# Patient Record
Sex: Male | Born: 1960 | Race: White | Hispanic: No | Smoking: Former smoker
Health system: Southern US, Community
[De-identification: ages and names within clinical notes are randomized; demographics above are authoritative.]

## PROBLEM LIST (undated history)

## (undated) DIAGNOSIS — E079 Disorder of thyroid, unspecified: Secondary | ICD-10-CM

## (undated) DIAGNOSIS — E7800 Pure hypercholesterolemia, unspecified: Secondary | ICD-10-CM

## (undated) DIAGNOSIS — E039 Hypothyroidism, unspecified: Secondary | ICD-10-CM

## (undated) DIAGNOSIS — M25551 Pain in right hip: Secondary | ICD-10-CM

## (undated) DIAGNOSIS — M1990 Unspecified osteoarthritis, unspecified site: Secondary | ICD-10-CM

## (undated) DIAGNOSIS — N400 Enlarged prostate without lower urinary tract symptoms: Secondary | ICD-10-CM

## (undated) DIAGNOSIS — G8929 Other chronic pain: Secondary | ICD-10-CM

## (undated) DIAGNOSIS — M25511 Pain in right shoulder: Secondary | ICD-10-CM

## (undated) DIAGNOSIS — M25552 Pain in left hip: Secondary | ICD-10-CM

## (undated) DIAGNOSIS — M25512 Pain in left shoulder: Secondary | ICD-10-CM

## (undated) HISTORY — PX: BACK SURGERY: SHX140

---

## 2011-03-22 ENCOUNTER — Other Ambulatory Visit (HOSPITAL_COMMUNITY): Payer: Self-pay | Admitting: *Deleted

## 2011-03-22 ENCOUNTER — Ambulatory Visit (HOSPITAL_COMMUNITY)
Admission: RE | Admit: 2011-03-22 | Discharge: 2011-03-22 | Disposition: A | Discharge status: Home / Self Care | Payer: Self-pay | Source: Ambulatory Visit | Attending: *Deleted | Admitting: *Deleted

## 2011-03-22 DIAGNOSIS — M79609 Pain in unspecified limb: Secondary | ICD-10-CM

## 2011-03-22 DIAGNOSIS — X58XXXA Exposure to other specified factors, initial encounter: Secondary | ICD-10-CM | POA: Insufficient documentation

## 2011-03-22 DIAGNOSIS — S4980XA Other specified injuries of shoulder and upper arm, unspecified arm, initial encounter: Secondary | ICD-10-CM | POA: Insufficient documentation

## 2011-03-22 DIAGNOSIS — M25519 Pain in unspecified shoulder: Secondary | ICD-10-CM | POA: Insufficient documentation

## 2011-03-22 DIAGNOSIS — S46909A Unspecified injury of unspecified muscle, fascia and tendon at shoulder and upper arm level, unspecified arm, initial encounter: Secondary | ICD-10-CM | POA: Insufficient documentation

## 2011-03-22 DIAGNOSIS — M899 Disorder of bone, unspecified: Secondary | ICD-10-CM | POA: Insufficient documentation

## 2011-03-22 DIAGNOSIS — M869 Osteomyelitis, unspecified: Secondary | ICD-10-CM | POA: Insufficient documentation

## 2012-06-24 ENCOUNTER — Emergency Department (HOSPITAL_COMMUNITY)
Admission: EM | Admit: 2012-06-24 | Discharge: 2012-06-24 | Disposition: A | Discharge status: Home / Self Care | Payer: Medicaid Other | Attending: Emergency Medicine | Admitting: Emergency Medicine

## 2012-06-24 ENCOUNTER — Encounter (HOSPITAL_COMMUNITY): Payer: Self-pay | Admitting: *Deleted

## 2012-06-24 DIAGNOSIS — M25511 Pain in right shoulder: Secondary | ICD-10-CM

## 2012-06-24 DIAGNOSIS — G8929 Other chronic pain: Secondary | ICD-10-CM | POA: Insufficient documentation

## 2012-06-24 DIAGNOSIS — F17200 Nicotine dependence, unspecified, uncomplicated: Secondary | ICD-10-CM | POA: Insufficient documentation

## 2012-06-24 DIAGNOSIS — IMO0001 Reserved for inherently not codable concepts without codable children: Secondary | ICD-10-CM | POA: Insufficient documentation

## 2012-06-24 DIAGNOSIS — M25519 Pain in unspecified shoulder: Secondary | ICD-10-CM | POA: Insufficient documentation

## 2012-06-24 DIAGNOSIS — M1990 Unspecified osteoarthritis, unspecified site: Secondary | ICD-10-CM

## 2012-06-24 DIAGNOSIS — M549 Dorsalgia, unspecified: Secondary | ICD-10-CM | POA: Insufficient documentation

## 2012-06-24 DIAGNOSIS — IMO0002 Reserved for concepts with insufficient information to code with codable children: Secondary | ICD-10-CM | POA: Insufficient documentation

## 2012-06-24 HISTORY — DX: Pain in left shoulder: M25.512

## 2012-06-24 HISTORY — DX: Pain in right shoulder: M25.511

## 2012-06-24 MED ORDER — ACETAMINOPHEN-CODEINE #3 300-30 MG PO TABS: 1.0000 | ORAL_TABLET | Freq: Four times a day (QID) | ORAL | Status: DC | PRN

## 2012-06-24 MED ORDER — DEXAMETHASONE 6 MG PO TABS: ORAL_TABLET | ORAL | Status: DC

## 2012-06-24 MED ORDER — DEXAMETHASONE SODIUM PHOSPHATE 4 MG/ML IJ SOLN
8.0000 mg | Freq: Once | INTRAMUSCULAR | Status: AC
  Administered 2012-06-24: 8 mg via INTRAMUSCULAR
  Filled 2012-06-24: qty 2

## 2012-06-24 NOTE — ED Provider Notes (Signed)
History     CSN: [id number]  Arrival date & time 06/24/12  1439   First MD Initiated Contact with Patient 06/24/12 1547      Chief Complaint  Patient presents with  . Shoulder Pain    (Consider location/radiation/quality/duration/timing/severity/associated sxs/prior treatment) Patient is a 52 y.o. male presenting with shoulder pain. The history is provided by the patient.  Shoulder Pain This is a chronic problem. The current episode started more than 1 month ago. The problem occurs daily. The problem has been gradually worsening. Associated symptoms include arthralgias and myalgias. Pertinent negatives include no abdominal pain, chest pain, coughing or neck pain. Exacerbated by: activity and weather changes. He has tried NSAIDs for the symptoms. The treatment provided moderate relief.    Past Medical History  Diagnosis Date  . Bilateral shoulder pain     Past Surgical History  Procedure Date  . Back surgery     No family history on file.  History  Substance Use Topics  . Smoking status: Current Every Day Smoker  . Smokeless tobacco: Not on file  . Alcohol Use: No      Review of Systems  Constitutional: Negative for activity change.       All ROS Neg except as noted in HPI  HENT: Negative for nosebleeds and neck pain.   Eyes: Negative for photophobia and discharge.  Respiratory: Negative for cough, shortness of breath and wheezing.   Cardiovascular: Negative for chest pain and palpitations.  Gastrointestinal: Negative for abdominal pain and blood in stool.  Genitourinary: Negative for dysuria, frequency and hematuria.  Musculoskeletal: Positive for myalgias, back pain and arthralgias.  Skin: Negative.   Neurological: Negative for dizziness, seizures and speech difficulty.  Psychiatric/Behavioral: Negative for hallucinations and confusion.    Allergies  Other  Home Medications   Current Outpatient Rx  Name  Route  Sig  Dispense  Refill  . IBUPROFEN 200 MG  PO TABS   Oral   Take 600-800 mg by mouth every 6 (six) hours as needed. For pain           BP 136/81  Pulse 85  Temp 97.3 F (36.3 C) (Oral)  Resp 20  Ht 5\' 8"  (1.727 m)  Wt 135 lb (61.236 kg)  BMI 20.53 kg/m2  SpO2 100%  Physical Exam  Nursing note and vitals reviewed. Constitutional: He is oriented to person, place, and time. He appears well-developed and well-nourished.  Non-toxic appearance.  HENT:  Head: Normocephalic.  Right Ear: Tympanic membrane and external ear normal.  Left Ear: Tympanic membrane and external ear normal.  Eyes: EOM and lids are normal. Pupils are equal, round, and reactive to light.  Neck: Normal range of motion. Neck supple. Carotid bruit is not present.  Cardiovascular: Normal rate, regular rhythm, normal heart sounds, intact distal pulses and normal pulses.   Pulmonary/Chest: Breath sounds normal. No respiratory distress.  Abdominal: Soft. Bowel sounds are normal. There is no tenderness. There is no guarding.  Musculoskeletal: Normal range of motion.       Pain with ROM of the right shoulder. No hot joints. Radial pulse symmetrical.  Deformity of the right bicep muscle (not new). DJD changes of the right and left upper ext. Cap refill less than 3 sec.  Lymphadenopathy:       Head (right side): No submandibular adenopathy present.       Head (left side): No submandibular adenopathy present.    He has no cervical adenopathy.  Neurological: He is alert  and oriented to person, place, and time. He has normal strength. No cranial nerve deficit or sensory deficit.  Skin: Skin is warm and dry.  Psychiatric: He has a normal mood and affect. His speech is normal.    ED Course  Procedures (including critical care time)  Labs Reviewed - No data to display No results found.   No diagnosis found.    MDM  I have reviewed nursing notes, vital signs, and all appropriate lab and imaging results for this patient. Patient states he has problems with  his right and left shoulders from time to time, but in the last week he has had more problems with his right shoulder extending down his right arm. The patient has had x-rays of these areas and show no evidence of fractures, but some demineralization changes are present. The patient is not seeing an orthopedic surgeon, and he is currently upset with his primary care physician and does not want to go see them at this time. The patient presents to the emergency department for additional evaluation and assistance with his pain. Examination is consistent with acute exacerbation of chronic degenerative joint changes. Prescription for Decadron 6 mg one daily and Tylenol with codeine #3 every 6 hours #20 tablets given to the patient. Patient given referral to orthopedics. Patient strongly advised to see his primary care physician or establish a primary care relationship.       Kathie Dike, [place] 06/24/12 1700

## 2012-06-24 NOTE — ED Notes (Signed)
Pt c/o right shoulder pain, fell on shoulder in November, has been seen by Dr. After the fall, but the pain has become worse over the past week,

## 2012-06-24 NOTE — ED Provider Notes (Signed)
Medical screening examination/treatment/procedure(s) were performed by non-physician practitioner and as supervising physician I was immediately available for consultation/collaboration.    Celene Kras, MD 06/24/12 [phone]

## 2013-09-26 ENCOUNTER — Other Ambulatory Visit: Payer: Self-pay | Admitting: Family Medicine

## 2013-09-26 ENCOUNTER — Ambulatory Visit (HOSPITAL_COMMUNITY)
Admission: RE | Admit: 2013-09-26 | Discharge: 2013-09-26 | Disposition: A | Discharge status: Home / Self Care | Payer: Medicare Other | Source: Ambulatory Visit | Attending: Family Medicine | Admitting: Family Medicine

## 2013-09-26 DIAGNOSIS — M1610 Unilateral primary osteoarthritis, unspecified hip: Secondary | ICD-10-CM | POA: Insufficient documentation

## 2013-09-26 DIAGNOSIS — M25551 Pain in right hip: Secondary | ICD-10-CM

## 2013-09-26 DIAGNOSIS — Z9181 History of falling: Secondary | ICD-10-CM | POA: Insufficient documentation

## 2013-09-26 DIAGNOSIS — M169 Osteoarthritis of hip, unspecified: Secondary | ICD-10-CM | POA: Insufficient documentation

## 2013-09-26 DIAGNOSIS — M25559 Pain in unspecified hip: Secondary | ICD-10-CM | POA: Insufficient documentation

## 2017-08-06 ENCOUNTER — Other Ambulatory Visit (HOSPITAL_COMMUNITY): Payer: Self-pay | Admitting: Family Medicine

## 2017-08-06 ENCOUNTER — Ambulatory Visit (HOSPITAL_COMMUNITY)
Admission: RE | Admit: 2017-08-06 | Discharge: 2017-08-06 | Disposition: A | Discharge status: Home / Self Care | Payer: Medicare Other | Source: Ambulatory Visit | Attending: Family Medicine | Admitting: Family Medicine

## 2017-08-06 DIAGNOSIS — G8929 Other chronic pain: Secondary | ICD-10-CM | POA: Diagnosis not present

## 2017-08-06 DIAGNOSIS — M160 Bilateral primary osteoarthritis of hip: Secondary | ICD-10-CM

## 2017-08-06 DIAGNOSIS — M799 Soft tissue disorder, unspecified: Secondary | ICD-10-CM | POA: Insufficient documentation

## 2017-09-25 ENCOUNTER — Emergency Department (HOSPITAL_COMMUNITY)
Admission: EM | Admit: 2017-09-25 | Discharge: 2017-09-25 | Disposition: A | Discharge status: Home / Self Care | Payer: Medicare Other | Attending: Emergency Medicine | Admitting: Emergency Medicine

## 2017-09-25 ENCOUNTER — Encounter (HOSPITAL_COMMUNITY): Payer: Self-pay | Admitting: Emergency Medicine

## 2017-09-25 ENCOUNTER — Other Ambulatory Visit: Payer: Self-pay

## 2017-09-25 DIAGNOSIS — Z87891 Personal history of nicotine dependence: Secondary | ICD-10-CM | POA: Diagnosis not present

## 2017-09-25 DIAGNOSIS — G5602 Carpal tunnel syndrome, left upper limb: Secondary | ICD-10-CM | POA: Diagnosis not present

## 2017-09-25 DIAGNOSIS — Z8739 Personal history of other diseases of the musculoskeletal system and connective tissue: Secondary | ICD-10-CM

## 2017-09-25 DIAGNOSIS — M159 Polyosteoarthritis, unspecified: Secondary | ICD-10-CM | POA: Insufficient documentation

## 2017-09-25 DIAGNOSIS — M791 Myalgia, unspecified site: Secondary | ICD-10-CM | POA: Diagnosis present

## 2017-09-25 HISTORY — DX: Disorder of thyroid, unspecified: E07.9

## 2017-09-25 HISTORY — DX: Pain in left hip: M25.552

## 2017-09-25 HISTORY — DX: Unspecified osteoarthritis, unspecified site: M19.90

## 2017-09-25 HISTORY — DX: Other chronic pain: G89.29

## 2017-09-25 HISTORY — DX: Pain in right hip: M25.551

## 2017-09-25 HISTORY — DX: Benign prostatic hyperplasia without lower urinary tract symptoms: N40.0

## 2017-09-25 HISTORY — DX: Pure hypercholesterolemia, unspecified: E78.00

## 2017-09-25 MED ORDER — DEXAMETHASONE SODIUM PHOSPHATE 10 MG/ML IJ SOLN
10.0000 mg | Freq: Once | INTRAMUSCULAR | Status: AC
  Administered 2017-09-25: 10 mg via INTRAMUSCULAR
  Filled 2017-09-25: qty 1

## 2017-09-25 MED ORDER — DEXAMETHASONE 4 MG PO TABS: 4.0000 mg | ORAL_TABLET | Freq: Two times a day (BID) | ORAL | 0 refills | Status: DC

## 2017-09-25 NOTE — ED Triage Notes (Signed)
PT c/o generalized joint/body aches over the past few months. PT states his PCP did blood work and hip xrays but he has no relief from naproxen at home.

## 2017-09-25 NOTE — Discharge Instructions (Signed)
I am concerned you may have a carpal tunnel problem with your left wrist.  Please use your wrist splint until seen by Dr. Romeo AppleHarrison, or the orthopedic specialist of your choice.  Heating pad to the area will be helpful.  Please continue your current medications.  Please add Decadron 2 times daily.

## 2017-09-25 NOTE — ED Provider Notes (Signed)
[facility] EMERGENCY DEPARTMENT Provider Note   CSN: [id number] Arrival date & time: 09/25/17  4098     History   Chief Complaint Chief Complaint  Patient presents with  . Generalized Body Aches    HPI Adam Robles is a 57 y.o. male.  Patient is a 57 year old male who presents to the emergency department with body aches, wrist pain, and hip pain.  The patient states that he has a history of arthritis, especially affecting his hip.  He states that in 2016 he sustained a major injury to the left upper extremity.  Since that time he has been having problems with pain off and on.  Most recently the pain is been more intense.  He states that especially at night he has throbbing pain of his left upper extremity.  He has a numbness and tingling of his left thumb, and he has an increasing pain of his wrist.  No recent injury or trauma to the left upper extremity.  He does not recall doing any excessive lifting, pushing, pulling, or straining involving the left upper extremity.  He states that this numb sensation at the very tip of his thumb comes and goes.  The pain seems to be worse at night, but can be present during the day as well.  He has been told that he had arthritis in multiple areas, but is not been told that he had any problem that would affect his thumb.  He presents now for assistance with this issue.  Primary CARE physician: Dr. Janna Arch.  The history is provided by the patient.    Past Medical History:  Diagnosis Date  . Arthritis   . Bilateral shoulder pain   . Chronic pain   . Chronic pain of both hips   . Enlarged prostate   . High cholesterol   . Thyroid disease     There are no active problems to display for this patient.   Past Surgical History:  Procedure Laterality Date  . BACK SURGERY     x2        Home Medications    Prior to Admission medications   Medication Sig Start Date End Date Taking? Authorizing Provider  acetaminophen-codeine  (TYLENOL #3) 300-30 MG per tablet Take 1-2 tablets by mouth every 6 (six) hours as needed for pain. 06/24/12   Ivery Quale, PA-C  dexamethasone (DECADRON) 6 MG tablet 1 po daily 06/24/12   Ivery Quale, PA-C  ibuprofen (ADVIL,MOTRIN) 200 MG tablet Take 600-800 mg by mouth every 6 (six) hours as needed. For pain    [provider]    Family History History reviewed. No pertinent family history.  Social History Social History   Tobacco Use  . Smoking status: Former Smoker    Types: Cigarettes    Last attempt to quit: 07/19/2016    Years since quitting: 1.1  . Smokeless tobacco: Never Used  Substance Use Topics  . Alcohol use: No  . Drug use: No     Allergies   Other   Review of Systems Review of Systems  Constitutional: Negative for activity change.       All ROS Neg except as noted in HPI  HENT: Negative for nosebleeds.   Eyes: Negative for photophobia and discharge.  Respiratory: Negative for cough, shortness of breath and wheezing.   Cardiovascular: Negative for chest pain and palpitations.  Gastrointestinal: Negative for abdominal pain and blood in stool.  Genitourinary: Negative for dysuria, frequency and hematuria.  Musculoskeletal:  Positive for arthralgias and myalgias. Negative for back pain and neck pain.  Skin: Negative.   Neurological: Negative for dizziness, seizures and speech difficulty.  Psychiatric/Behavioral: Negative for confusion and hallucinations.     Physical Exam Updated Vital Signs BP (!) 154/70 (BP Location: Right Arm)   Pulse 80   Temp 97.6 F (36.4 C) (Oral)   Resp 16   Ht 6' (1.829 m)   Wt 77.6 kg (171 lb)   SpO2 97%   BMI 23.19 kg/m   Physical Exam  Constitutional: He is oriented to person, place, and time. He appears well-developed and well-nourished.  Non-toxic appearance.  HENT:  Head: Normocephalic.  Right Ear: Tympanic membrane and external ear normal.  Left Ear: Tympanic membrane and external ear normal.  Eyes:  Pupils are equal, round, and reactive to light. EOM and lids are normal.  Neck: Normal range of motion. Neck supple. Carotid bruit is not present.  Cardiovascular: Normal rate, regular rhythm, normal heart sounds, intact distal pulses and normal pulses.  Pulmonary/Chest: Breath sounds normal. No respiratory distress.  Abdominal: Soft. Bowel sounds are normal. There is no tenderness. There is no guarding.  Musculoskeletal: Normal range of motion.  There is good range of motion of the left shoulder and elbow.  There is pain with range of motion involving the left wrist.  There is some pain to percussion.  There is a positive Tinel's sign.  There is no atrophy of the thenar eminence.  Capillary refill of the left upper extremity is 2 seconds.  Radial pulses 2+. No hot joints of the upper extremities right or left.  Lymphadenopathy:       Head (right side): No submandibular adenopathy present.       Head (left side): No submandibular adenopathy present.    He has no cervical adenopathy.  Neurological: He is alert and oriented to person, place, and time. He has normal strength. No cranial nerve deficit or sensory deficit.  Skin: Skin is warm and dry.  Psychiatric: He has a normal mood and affect. His speech is normal.  Nursing note and vitals reviewed.    ED Treatments / Results  Labs (all labs ordered are listed, but only abnormal results are displayed) Labs Reviewed - No data to display  EKG None  Radiology No results found.  Procedures Procedures (including critical care time)  Medications Ordered in ED Medications - No data to display   Initial Impression / Assessment and Plan / ED Course  I have reviewed the triage vital signs and the nursing notes.  Pertinent labs & imaging results that were available during my care of the patient were reviewed by me and considered in my medical decision making (see chart for details).      Final Clinical Impressions(s) / ED  Diagnoses MDM  Vital signs reviewed.  Recent laboratory records from Dr. Otilio Saber office reviewed.  Patient has a history of arthritis and chronic problems with hip pain, and wrist pain.  The examination questions possible carpal tunnel on the left.  Patient fitted with a wrist splint.  He is referred to Dr. Romeo Apple for orthopedic evaluation concerning the possible carpal tunnel.   Final diagnoses:  Carpal tunnel syndrome of left wrist  History of arthritis    ED Discharge Orders        Ordered    dexamethasone (DECADRON) 4 MG tablet  2 times daily with meals     09/25/17 1021       Ivery Quale, PA-C  09/25/17 1022    Samuel JesterMcManus, Kathleen, DO 09/28/17 [phone]

## 2017-09-25 NOTE — ED Notes (Signed)
Pt reports chronic bilateral hip and lower back pain. Occasional neck pain

## 2018-01-02 ENCOUNTER — Ambulatory Visit (HOSPITAL_COMMUNITY)
Admission: RE | Admit: 2018-01-02 | Discharge: 2018-01-02 | Disposition: A | Discharge status: Home / Self Care | Payer: Medicare Other | Source: Ambulatory Visit | Attending: Family Medicine | Admitting: Family Medicine

## 2018-01-02 ENCOUNTER — Other Ambulatory Visit (HOSPITAL_COMMUNITY): Payer: Self-pay | Admitting: Family Medicine

## 2018-01-02 DIAGNOSIS — R609 Edema, unspecified: Secondary | ICD-10-CM | POA: Insufficient documentation

## 2018-01-02 DIAGNOSIS — M25532 Pain in left wrist: Secondary | ICD-10-CM | POA: Insufficient documentation

## 2018-01-02 DIAGNOSIS — M11232 Other chondrocalcinosis, left wrist: Secondary | ICD-10-CM | POA: Insufficient documentation

## 2018-01-02 DIAGNOSIS — M19032 Primary osteoarthritis, left wrist: Secondary | ICD-10-CM | POA: Diagnosis not present

## 2018-02-12 ENCOUNTER — Encounter: Payer: Self-pay | Admitting: Orthopaedic Surgery

## 2018-02-12 ENCOUNTER — Ambulatory Visit (INDEPENDENT_AMBULATORY_CARE_PROVIDER_SITE_OTHER): Payer: Medicare Other | Admitting: Orthopaedic Surgery

## 2018-02-12 VITALS — BP 115/71 | HR 83 | Ht 73.0 in | Wt 169.0 lb

## 2018-02-12 DIAGNOSIS — M25532 Pain in left wrist: Secondary | ICD-10-CM | POA: Diagnosis not present

## 2018-02-12 NOTE — Progress Notes (Signed)
Subjective:    Patient ID: Adam Robles, male    DOB: 09-07-1960, 57 y.o.   MRN: [id number]  HPI He has had pain in the left wrist for several months.  He has seen his family doctor, Dr. Janna Arch for this and the ER as well.  He has numbness of the fingers of the left hand, the index, long and ring.  He has pain of the left wrist with swelling and decreased ROM.  He has no redness.  He denies any trauma.  He has tried ice, heat, Advil with only slight help.  He has had prednisone which helped for a while.  He has been on Naprosyn which helps some.  He has a cock-up splint that helps but he is tired of using it.  He has no redness.  He has pain medicine for chronic pain.  X-rays showed: IMPRESSION: No traumatic finding. 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   Review of Systems  Constitutional: Positive for activity change.  Musculoskeletal: Positive for arthralgias and joint swelling.  All other systems reviewed and are negative.  For Review of Systems, all other systems reviewed and are negative.  The following is a summary of the past history medically, past history surgically, known current medicines, social history and family history.  This information is gathered electronically by the computer from prior information and documentation.  I review this each visit and have found including this information at this point in the chart is beneficial and informative.   Past Medical History:  Diagnosis Date  . Arthritis   . Bilateral shoulder pain   . Chronic pain   . Chronic pain of both hips   . Enlarged prostate   . High cholesterol   . Thyroid disease     Past Surgical History:  Procedure Laterality Date  . BACK SURGERY     x2    Current Outpatient Medications on File Prior to Visit  Medication Sig Dispense Refill  .  gabapentin (NEURONTIN) 300 MG capsule Take by mouth 3 (three) times daily.  3  . levothyroxine (SYNTHROID, LEVOTHROID) 150 MCG tablet Take 150 mcg by mouth daily.  5  . naproxen (NAPROSYN) 500 MG tablet Take 500 mg by mouth 2 (two) times daily with a meal.  3  . oxyCODONE (OXY IR/ROXICODONE) 5 MG immediate release tablet Take 5 mg by mouth 4 (four) times daily.  0  . pravastatin (PRAVACHOL) 40 MG tablet Take 40 mg by mouth daily.  6  . tamsulosin (FLOMAX) 0.4 MG CAPS capsule      No current facility-administered medications on file prior to visit.     Social History   Socioeconomic History  . Marital status: Legally Separated    Spouse name: Not on file  . Number of children: Not on file  . Years of education: Not on file  . Highest education level: Not on file  Occupational History  . Not on file  Social Needs  . Financial resource strain: Not on file  . Food insecurity:    Worry: Not on file    Inability: Not on file  . Transportation needs:    Medical: Not on file    Non-medical: Not on file  Tobacco Use  . Smoking status: Former Smoker    Types: Cigarettes    Last attempt to quit: 07/19/2016    Years since quitting:  1.5  . Smokeless tobacco: Never Used  Substance and Sexual Activity  . Alcohol use: No  . Drug use: No  . Sexual activity: Not on file  Lifestyle  . Physical activity:    Days per week: Not on file    Minutes per session: Not on file  . Stress: Not on file  Relationships  . Social connections:    Talks on phone: Not on file    Gets together: Not on file    Attends religious service: Not on file    Active member of club or organization: Not on file    Attends meetings of clubs or organizations: Not on file    Relationship status: Not on file  . Intimate partner violence:    Fear of current or ex partner: Not on file    Emotionally abused: Not on file    Physically abused: Not on file    Forced sexual activity: Not on file  Other Topics Concern  .  Not on file  Social History Narrative  . Not on file    Family History  Problem Relation Age of Onset  . Cancer Mother   . Emphysema Father     BP 115/71   Pulse 83   Ht 6\' 1"  (1.854 m)   Wt 169 lb (76.7 kg)   BMI 22.30 kg/m   Body mass index is 22.3 kg/m.     Objective:   Physical Exam  Constitutional: He is oriented to person, place, and time. He appears well-developed and well-nourished.  HENT:  Head: Normocephalic and atraumatic.  Eyes: Pupils are equal, round, and reactive to light. Conjunctivae and EOM are normal.  Neck: Normal range of motion. Neck supple.  Cardiovascular: Normal rate, regular rhythm and intact distal pulses.  Pulmonary/Chest: Effort normal.  Abdominal: Soft.  Musculoskeletal:       Left wrist: He exhibits decreased range of motion, tenderness and swelling.       Hands: Neurological: He is alert and oriented to person, place, and time. He has normal reflexes. He displays normal reflexes. No cranial nerve deficit. He exhibits normal muscle tone. Coordination normal.  Skin: Skin is warm and dry.  Psychiatric: He has a normal mood and affect. His behavior is normal. Judgment and thought content normal.    I have reviewed the notes from Dr. Janna ArchonDiego.  I have reviewed his xray report and X-rays.  I have reviewed the ER notes.      Assessment & Plan:   Encounter Diagnosis  Name Primary?  . Left wrist pain Yes   I would like to get EMGs to rule out carpal tunnel.  I would like to get MRI of the left wrist.  He has degenerative changes more of the radial side of the wrist with changes in the triangular cartilage area.  He is to continue his Naprosyn and the cock-up splint.  Return after the above studies are done.  Call if any problem.  Precautions discussed.   Electronically Signed Darreld McleanWayne Elaysia Devargas, MD 8/28/20198:21 AM

## 2018-02-12 NOTE — Patient Instructions (Signed)
..  An order has been sent to [facility]  [address] [place], [place]  [address].  Phone number [phone] requesting a nerve conduction study for you.  They will call you with an appointment.  If you have not been given an appointment in a reasonable amount of time please call them and ask to be scheduled.  Your MRI has been scheduled for Tuesday 02/18/18 at 3:pppm.  You will need to arive at [facility] Radiology department at 2:30pm.

## 2018-02-18 ENCOUNTER — Ambulatory Visit (HOSPITAL_COMMUNITY)
Admission: RE | Admit: 2018-02-18 | Discharge: 2018-02-18 | Disposition: A | Discharge status: Home / Self Care | Payer: Medicare Other | Source: Ambulatory Visit | Attending: Orthopaedic Surgery | Admitting: Orthopaedic Surgery

## 2018-02-18 DIAGNOSIS — M25532 Pain in left wrist: Secondary | ICD-10-CM | POA: Diagnosis not present

## 2018-03-05 ENCOUNTER — Ambulatory Visit (INDEPENDENT_AMBULATORY_CARE_PROVIDER_SITE_OTHER): Payer: Medicare Other | Admitting: Orthopaedic Surgery

## 2018-03-05 ENCOUNTER — Encounter: Payer: Self-pay | Admitting: Orthopaedic Surgery

## 2018-03-05 VITALS — BP 116/68 | HR 77 | Ht 73.0 in | Wt 169.0 lb

## 2018-03-05 DIAGNOSIS — G5602 Carpal tunnel syndrome, left upper limb: Secondary | ICD-10-CM | POA: Diagnosis not present

## 2018-03-05 NOTE — Progress Notes (Signed)
Patient ZO:XWRUEAV:Adam Robles, male DOB:06/12/1961, 57 y.o. WUJ:[id number]  Chief Complaint  Patient presents with  . Wrist Pain    left    HPI  Adam Robles is a 57 y.o. male who has pain the left wrist.  Adam Robles had EMGs done which show bilateral carpal tunnel, more on the left. I have explained the findings of the EMG to him.  I have recommended surgery.  I will have Dr. Romeo AppleHarrison see him. The patient is agreeable to this.   Body mass index is 22.3 kg/m.  ROS  Review of Systems  Constitutional: Positive for activity change.  Musculoskeletal: Positive for arthralgias and joint swelling.  All other systems reviewed and are negative.   All other systems reviewed and are negative.  The following is a summary of the past history medically, past history surgically, known current medicines, social history and family history.  This information is gathered electronically by the computer from prior information and documentation.  I review this each visit and have found including this information at this point in the chart is beneficial and informative.    Past Medical History:  Diagnosis Date  . Arthritis   . Bilateral shoulder pain   . Chronic pain   . Chronic pain of both hips   . Enlarged prostate   . High cholesterol   . Thyroid disease     Past Surgical History:  Procedure Laterality Date  . BACK SURGERY     x2    Family History  Problem Relation Age of Onset  . Cancer Mother   . Emphysema Father     Social History Social History   Tobacco Use  . Smoking status: Former Smoker    Types: Cigarettes    Last attempt to quit: 07/19/2016    Years since quitting: 1.6  . Smokeless tobacco: Never Used  Substance Use Topics  . Alcohol use: No  . Drug use: No    Allergies  Allergen Reactions  . Other     Can't take some pain medications due to gastric ulcer.     Current Outpatient Medications  Medication Sig Dispense Refill  . gabapentin (NEURONTIN) 300 MG capsule  Take by mouth 3 (three) times daily.  3  . levothyroxine (SYNTHROID, LEVOTHROID) 150 MCG tablet Take 150 mcg by mouth daily.  5  . naproxen (NAPROSYN) 500 MG tablet Take 500 mg by mouth 2 (two) times daily with a meal.  3  . oxyCODONE (OXY IR/ROXICODONE) 5 MG immediate release tablet Take 5 mg by mouth 4 (four) times daily.  0  . pravastatin (PRAVACHOL) 40 MG tablet Take 40 mg by mouth daily.  6  . tamsulosin (FLOMAX) 0.4 MG CAPS capsule      No current facility-administered medications for this visit.      Physical Exam  Blood pressure 116/68, pulse 77, height 6\' 1"  (1.854 m), weight 169 lb (76.7 kg).  Constitutional: overall normal hygiene, normal nutrition, well developed, normal grooming, normal body habitus. Assistive device:left cock-up splint  Musculoskeletal: gait and station Limp none, muscle tone and strength are normal, no tremors or atrophy is present.  .  Neurological: coordination overall normal.  Deep tendon reflex/nerve stretch intact.  Sensation normal.  Cranial nerves II-XII intact.   Skin:   Normal overall no scars, lesions, ulcers or rashes. No psoriasis.  Psychiatric: Alert and oriented x 3.  Recent memory intact, remote memory unclear.  Normal mood and affect. Well groomed.  Good eye contact.  Cardiovascular: overall no swelling, no varicosities, no edema bilaterally, normal temperatures of the legs and arms, no clubbing, cyanosis and good capillary refill.  Lymphatic: palpation is normal.  Adam Robles has positive Phalen and negative Tinel on the left and right wrist.  The left wrist is more painful.  Adam Robles has decreased sensation of the median nerve.  All other systems reviewed and are negative   The patient has been educated about the nature of the problem(s) and counseled on treatment options.  The patient appeared to understand what I have discussed and is in agreement with it.  Encounter Diagnosis  Name Primary?  . Carpal tunnel syndrome on left Yes     PLAN Call if any problems.  Precautions discussed.  Continue current medications.   Return to clinic to see Dr. Romeo Apple   Electronically Signed Darreld Mclean, MD 9/18/20199:30 AM

## 2018-03-14 ENCOUNTER — Ambulatory Visit (INDEPENDENT_AMBULATORY_CARE_PROVIDER_SITE_OTHER): Payer: Medicare Other | Admitting: Orthopedic Surgery

## 2018-03-14 ENCOUNTER — Encounter: Payer: Self-pay | Admitting: Orthopedic Surgery

## 2018-03-14 VITALS — BP 119/77 | HR 65 | Ht 73.0 in | Wt 168.0 lb

## 2018-03-14 DIAGNOSIS — G5602 Carpal tunnel syndrome, left upper limb: Secondary | ICD-10-CM | POA: Diagnosis not present

## 2018-03-14 NOTE — H&P (View-Only) (Signed)
PREOP CONSULT/REFERRAL INTRA-OFFICE FROM DR JW KEELING   Chief Complaint  Patient presents with  . Carpal Tunnel    left     This 57-year-old male has had 6 months of pain in his left and right hand with 3 months of pain and swelling in the left upper extremity with complaints of burning pain mild to moderate not improved with splinting.  He saw Dr. Keeling is nonoperative treatments did not work he had a nerve study and EMG study which shows he has bilateral moderate carpal tunnel syndrome  He presents for surgical evaluation he does have a risk factor of thyroid disease is being hypothyroid.  His other treatments included Naprosyn and gabapentin without relief he is ready for surgery on his left hand   Review of Systems  Constitutional: Negative for fever.  Musculoskeletal: Positive for joint pain.  Skin: Negative.   Neurological: Positive for tingling.     Past Medical History:  Diagnosis Date  . Arthritis   . Bilateral shoulder pain   . Chronic pain   . Chronic pain of both hips   . Enlarged prostate   . High cholesterol   . Thyroid disease     Past Surgical History:  Procedure Laterality Date  . BACK SURGERY     x2    Family History  Problem Relation Age of Onset  . Cancer Mother   . Emphysema Father    Social History   Tobacco Use  . Smoking status: Former Smoker    Types: Cigarettes    Last attempt to quit: 07/19/2016    Years since quitting: 1.6  . Smokeless tobacco: Never Used  Substance Use Topics  . Alcohol use: No  . Drug use: No    Allergies  Allergen Reactions  . Other     Can't take some pain medications due to gastric ulcer.      Current Meds  Medication Sig  . gabapentin (NEURONTIN) 300 MG capsule Take by mouth 3 (three) times daily.  . levothyroxine (SYNTHROID, LEVOTHROID) 150 MCG tablet Take 150 mcg by mouth daily.  . naproxen (NAPROSYN) 500 MG tablet Take 500 mg by mouth 2 (two) times daily with a meal.  . oxyCODONE (OXY  IR/ROXICODONE) 5 MG immediate release tablet Take 5 mg by mouth 4 (four) times daily.  . pravastatin (PRAVACHOL) 40 MG tablet Take 40 mg by mouth daily.  . tamsulosin (FLOMAX) 0.4 MG CAPS capsule     BP 119/77   Pulse 65   Ht 6' 1" (1.854 m)   Wt 168 lb (76.2 kg)   BMI 22.16 kg/m    Physical Exam  Constitutional: He is oriented to person, place, and time. He appears well-developed and well-nourished.  Vital signs have been reviewed and are stable. Gen. appearance the patient is well-developed and well-nourished with normal grooming and hygiene.   Neurological: He is alert and oriented to person, place, and time.  Skin: Skin is warm and dry. No erythema.  Psychiatric: He has a normal mood and affect.  Vitals reviewed.   Ortho Exam   Left upper extremity swelling around the wrist joint with tenderness and decreased range of motion the wrist joint remains stable his grip strength seems less than normal his skin is intact he has a good pulse and normal temperatures is no lymphadenopathy he has decreased sensation in his thumb index and long finger no pathologic reflexes were elicited and his overall coordination was normal  Right upper extremity again exhibits   sensory changes in the median nerve with normal pulse and perfusion.  Elbow area lymph nodes negative skin intact strength and muscle tone are normal wrist is stable and range of motion no deficits no malalignment is seen.  MEDICAL DECISION SECTION  MRI report wristIMPRESSION: 1. Suspected erosions along the radiocarpal joints with effusion and possible synovitis in the radiocarpal joint and distal radioulnar joint. Appearance suspicious for erosive or inflammatory arthropathy. The patient has known calcification in the TFCC disc, based on conventional radiographs, and CPPD arthropathy is not excluded. 2. Although not well seen on today's MRI, conventional radiographs raise the possibility of a tear of the scapholunate  ligament. 3. Distal flexor carpi radialis tendinopathy. 4. Despite efforts by the technologist and patient, motion artifact is present on today's exam and could not be eliminated. This reduces exam sensitivity and specificity.     Electronically Signed   By: Walter  Liebkemann M.D.   On: 02/19/2018 07:33  He also had an x-ray of his wrist 3 views My interpretation of that x-rays that he has radial scaphoid and scaphoid trapezial arthritis    Encounter Diagnosis  Name Primary?  . Carpal tunnel syndrome on left Yes     PLAN:   Surgical procedure planned: LEFT CARPAL TUNNEL RELEASE  The procedure has been fully reviewed with the patient; The risks and benefits of surgery have been discussed and explained and understood. Alternative treatment has also been reviewed, questions were encouraged and answered. The postoperative plan is also been reviewed.  Nonsurgical treatment as described in the history and physical section was attempted and unsuccessful and the patient has agreed to proceed with surgical intervention to improve their situation.  No orders of the defined types were placed in this encounter.   Stanley Harrison, MD 03/14/2018 9:05 AM   

## 2018-03-14 NOTE — Patient Instructions (Signed)
Carpal Tunnel Release Carpal tunnel release is a surgical procedure to relieve numbness and pain in your hand that are caused by carpal tunnel syndrome. Your carpal tunnel is a narrow, hollow space in your wrist. It passes between your wrist bones and a band of connective tissue (transverse carpal ligament). The nerve that supplies most of your hand (median nerve) passes through this space, and so do the connections between your fingers and the muscles of your arm (tendons). Carpal tunnel syndrome makes this space swell and become narrow, and this causes pain and numbness. In carpal tunnel release surgery, a surgeon cuts through the transverse carpal ligament to make more room in the carpal tunnel space. You may have this surgery if other types of treatment have not worked. Tell a health care provider about:  Any allergies you have.  All medicines you are taking, including vitamins, herbs, eye drops, creams, and over-the-counter medicines.  Any problems you or family members have had with anesthetic medicines.  Any blood disorders you have.  Any surgeries you have had.  Any medical conditions you have. What are the risks? Generally, this is a safe procedure. However, problems may occur, including:  Bleeding.  Infection.  Injury to the median nerve.  Need for additional surgery.  What happens before the procedure?  Ask your health care provider about: ? Changing or stopping your regular medicines. This is especially important if you are taking diabetes medicines or blood thinners. ? Taking medicines such as aspirin and ibuprofen. These medicines can thin your blood. Do not take these medicines before your procedure if your health care provider instructs you not to.  Do not eat or drink anything after midnight on the night before the procedure or as directed by your health care provider.  Plan to have someone take you home after the procedure. What happens during the  procedure?  An IV tube may be inserted into a vein.  You will be given one of the following: ? A medicine that numbs the wrist area (local anesthetic). You may also be given a medicine to make you relax (sedative). ? A medicine that makes you go to sleep (general anesthetic).  Your arm, hand, and wrist will be cleaned with a germ-killing solution (antiseptic).  Your surgeon will make a surgical cut (incision) over the palm side of your wrist. The surgeon will pull aside the skin of your wrist to expose the carpal tunnel space.  The surgeon will cut the transverse carpal ligament.  The edges of the incision will be closed with stitches (sutures) or staples.  A bandage (dressing) will be placed over your wrist and wrapped around your hand and wrist. What happens after the procedure?  You may spend some time in a recovery area.  Your blood pressure, heart rate, breathing rate, and blood oxygen level will be monitored often until the medicines you were given have worn off.  You will likely have some pain. You will be given pain medicine.  You may need to wear a splint or a wrist brace over your dressing. This information is not intended to replace advice given to you by your health care provider. Make sure you discuss any questions you have with your health care provider. Document Released: 08/25/2003 Document Revised: 11/10/2015 Document Reviewed: 01/20/2014 Elsevier Interactive Patient Education  2018 Elsevier Inc[employer]  

## 2018-03-14 NOTE — Progress Notes (Signed)
PREOP CONSULT/REFERRAL INTRA-OFFICE FROM DR Gaylene Brooks   Chief Complaint  Patient presents with  . Carpal Tunnel    left     This 57 year old male has had 6 months of pain in his left and right hand with 3 months of pain and swelling in the left upper extremity with complaints of burning pain mild to moderate not improved with splinting.  He saw Dr. Hilda Lias is nonoperative treatments did not work he had a nerve study and EMG study which shows he has bilateral moderate carpal tunnel syndrome  He presents for surgical evaluation he does have a risk factor of thyroid disease is being hypothyroid.  His other treatments included Naprosyn and gabapentin without relief he is ready for surgery on his left hand   Review of Systems  Constitutional: Negative for fever.  Musculoskeletal: Positive for joint pain.  Skin: Negative.   Neurological: Positive for tingling.     Past Medical History:  Diagnosis Date  . Arthritis   . Bilateral shoulder pain   . Chronic pain   . Chronic pain of both hips   . Enlarged prostate   . High cholesterol   . Thyroid disease     Past Surgical History:  Procedure Laterality Date  . BACK SURGERY     x2    Family History  Problem Relation Age of Onset  . Cancer Mother   . Emphysema Father    Social History   Tobacco Use  . Smoking status: Former Smoker    Types: Cigarettes    Last attempt to quit: 07/19/2016    Years since quitting: 1.6  . Smokeless tobacco: Never Used  Substance Use Topics  . Alcohol use: No  . Drug use: No    Allergies  Allergen Reactions  . Other     Can't take some pain medications due to gastric ulcer.      Current Meds  Medication Sig  . gabapentin (NEURONTIN) 300 MG capsule Take by mouth 3 (three) times daily.  Marland Kitchen levothyroxine (SYNTHROID, LEVOTHROID) 150 MCG tablet Take 150 mcg by mouth daily.  . naproxen (NAPROSYN) 500 MG tablet Take 500 mg by mouth 2 (two) times daily with a meal.  . oxyCODONE (OXY  IR/ROXICODONE) 5 MG immediate release tablet Take 5 mg by mouth 4 (four) times daily.  . pravastatin (PRAVACHOL) 40 MG tablet Take 40 mg by mouth daily.  . tamsulosin (FLOMAX) 0.4 MG CAPS capsule     BP 119/77   Pulse 65   Ht 6\' 1"  (1.854 m)   Wt 168 lb (76.2 kg)   BMI 22.16 kg/m    Physical Exam  Constitutional: He is oriented to person, place, and time. He appears well-developed and well-nourished.  Vital signs have been reviewed and are stable. Gen. appearance the patient is well-developed and well-nourished with normal grooming and hygiene.   Neurological: He is alert and oriented to person, place, and time.  Skin: Skin is warm and dry. No erythema.  Psychiatric: He has a normal mood and affect.  Vitals reviewed.   Ortho Exam   Left upper extremity swelling around the wrist joint with tenderness and decreased range of motion the wrist joint remains stable his grip strength seems less than normal his skin is intact he has a good pulse and normal temperatures is no lymphadenopathy he has decreased sensation in his thumb index and long finger no pathologic reflexes were elicited and his overall coordination was normal  Right upper extremity again exhibits  sensory changes in the median nerve with normal pulse and perfusion.  Elbow area lymph nodes negative skin intact strength and muscle tone are normal wrist is stable and range of motion no deficits no malalignment is seen.  MEDICAL DECISION SECTION  MRI report wristIMPRESSION: 1. Suspected erosions along the radiocarpal joints with effusion and possible synovitis in the radiocarpal joint and distal radioulnar joint. Appearance suspicious for erosive or inflammatory arthropathy. The patient has known calcification in the TFCC disc, based on conventional radiographs, and CPPD arthropathy is not excluded. 2. Although not well seen on today's MRI, conventional radiographs raise the possibility of a tear of the scapholunate  ligament. 3. Distal flexor carpi radialis tendinopathy. 4. Despite efforts by the technologist and patient, motion artifact is present on today's exam and could not be eliminated. This reduces exam sensitivity and specificity.     Electronically Signed   By: Gaylyn Rong M.D.   On: 02/19/2018 07:33  He also had an x-ray of his wrist 3 views My interpretation of that x-rays that he has radial scaphoid and scaphoid trapezial arthritis    Encounter Diagnosis  Name Primary?  . Carpal tunnel syndrome on left Yes     PLAN:   Surgical procedure planned: LEFT CARPAL TUNNEL RELEASE  The procedure has been fully reviewed with the patient; The risks and benefits of surgery have been discussed and explained and understood. Alternative treatment has also been reviewed, questions were encouraged and answered. The postoperative plan is also been reviewed.  Nonsurgical treatment as described in the history and physical section was attempted and unsuccessful and the patient has agreed to proceed with surgical intervention to improve their situation.  No orders of the defined types were placed in this encounter.   Fuller Canada, MD 03/14/2018 9:05 AM

## 2018-03-24 NOTE — Patient Instructions (Signed)
Your procedure is scheduled on: 03/27/2018  Report to [facility] at   11:00  AM.  Call this number if you have problems the morning of surgery: [phone]   Remember:   Do not drink or eat food:After Midnight.  :  Take these medicines the morning of surgery with A SIP OF WATER: Levothyroxine   Take Gabapentin and oxycodone if needed      Do not wear jewelry, make-up or nail polish.  Do not wear lotions, powders, or perfumes. You may wear deodorant.  Do not shave 48 hours prior to surgery. Men may shave face and neck.  Do not bring valuables to the hospital.  Contacts, dentures or bridgework may not be worn into surgery.  Leave suitcase in the car. After surgery it may be brought to your room.  For patients admitted to the hospital, checkout time is 11:00 AM the day of discharge.   Patients discharged the day of surgery will not be allowed to drive home.    Special Instructions: Shower using CHG night before surgery and shower the day of surgery use CHG.  Use special wash - you have one bottle of CHG for all showers.  You should use approximately 1/2 of the bottle for each shower.  Carpal Tunnel Release Carpal tunnel release is a surgical procedure to relieve numbness and pain in your hand that are caused by carpal tunnel syndrome. Your carpal tunnel is a narrow, hollow space in your wrist. It passes between your wrist bones and a band of connective tissue (transverse carpal ligament). The nerve that supplies most of your hand (median nerve) passes through this space, and so do the connections between your fingers and the muscles of your arm (tendons). Carpal tunnel syndrome makes this space swell and become narrow, and this causes pain and numbness. In carpal tunnel release surgery, a surgeon cuts through the transverse carpal ligament to make more room in the carpal tunnel space. You may have this surgery if other types of treatment have not worked. Tell a health care provider  about:  Any allergies you have.  All medicines you are taking, including vitamins, herbs, eye drops, creams, and over-the-counter medicines.  Any problems you or family members have had with anesthetic medicines.  Any blood disorders you have.  Any surgeries you have had.  Any medical conditions you have. What are the risks? Generally, this is a safe procedure. However, problems may occur, including:  Bleeding.  Infection.  Injury to the median nerve.  Need for additional surgery.  What happens before the procedure?  Ask your health care provider about: ? Changing or stopping your regular medicines. This is especially important if you are taking diabetes medicines or blood thinners. ? Taking medicines such as aspirin and ibuprofen. These medicines can thin your blood. Do not take these medicines before your procedure if your health care provider instructs you not to.  Do not eat or drink anything after midnight on the night before the procedure or as directed by your health care provider.  Plan to have someone take you home after the procedure. What happens during the procedure?  An IV tube may be inserted into a vein.  You will be given one of the following: ? A medicine that numbs the wrist area (local anesthetic). You may also be given a medicine to make you relax (sedative). ? A medicine that makes you go to sleep (general anesthetic).  Your arm, hand, and wrist will be cleaned  with a germ-killing solution (antiseptic).  Your surgeon will make a surgical cut (incision) over the palm side of your wrist. The surgeon will pull aside the skin of your wrist to expose the carpal tunnel space.  The surgeon will cut the transverse carpal ligament.  The edges of the incision will be closed with stitches (sutures) or staples.  A bandage (dressing) will be placed over your wrist and wrapped around your hand and wrist. What happens after the procedure?  You may spend some  time in a recovery area.  Your blood pressure, heart rate, breathing rate, and blood oxygen level will be monitored often until the medicines you were given have worn off.  You will likely have some pain. You will be given pain medicine.  You may need to wear a splint or a wrist brace over your dressing. This information is not intended to replace advice given to you by your health care provider. Make sure you discuss any questions you have with your health care provider. Document Released: 08/25/2003 Document Revised: 11/10/2015 Document Reviewed: 01/20/2014 Elsevier Interactive Patient Education  2018 [employer] Inc[employer] Carpal Tunnel Release, Care After Refer to this sheet in the next few weeks. These instructions provide you with information about caring for yourself after your procedure. Your health care provider may also give you more specific instructions. Your treatment has been planned according to current medical practices, but problems sometimes occur. Call your health care provider if you have any problems or questions after your procedure. What can I expect after the procedure? After your procedure, it is typical to have the following:  Pain.  Numbness.  Tingling.  Swelling.  Stiffness.  Bruising.  Follow these instructions at home:  Take medicines only as directed by your health care provider.  There are many different ways to close and cover an incision, including stitches (sutures), skin glue, and adhesive strips. Follow your health care provider's instructions about: ? Incision care. ? Bandage (dressing) changes and removal. ? Incision closure removal.  Wear a splint or a brace as directed by your surgeon. You may need to do this for 2-3 weeks.  Keep your hand raised (elevated) above the level of your heart while you are resting. Move your fingers often.  Avoid activities that cause hand pain.  Ask your surgeon when you can start to do all of your usual  activities again, such as: ? Driving. ? Returning to work. ? Bathing and swimming.  Keep all follow-up visits as directed by your health care provider. This is important. You may need physical therapy for several months to speed healing and regain movement. Contact a health care provider if:  You have drainage, redness, swelling, or pain at your incision site.  You have a fever.  You have chills.  Your pain medicine is not working.  Your symptoms do not go away after 2 months.  Your symptoms go away and then return. Get help right away if:  You have pain or numbness that is getting worse.  Your fingers change color.  You are not able to move your fingers. This information is not intended to replace advice given to you by your health care provider. Make sure you discuss any questions you have with your health care provider. Document Released: 12/22/2004 Document Revised: 11/10/2015 Document Reviewed: 01/20/2014 Elsevier Interactive Patient Education  2018 [employer] Inc[employer]  Monitored Anesthesia Care, Care After These instructions provide you with information about caring for yourself after your procedure. Your health care  provider may also give you more specific instructions. Your treatment has been planned according to current medical practices, but problems sometimes occur. Call your health care provider if you have any problems or questions after your procedure. What can I expect after the procedure? After your procedure, it is common to:  Feel sleepy for several hours.  Feel clumsy and have poor balance for several hours.  Feel forgetful about what happened after the procedure.  Have poor judgment for several hours.  Feel nauseous or vomit.  Have a sore throat if you had a breathing tube during the procedure.  Follow these instructions at home: For at least 24 hours after the procedure:   Do not: ? Participate in activities in which you could fall or become  injured. ? Drive. ? Use heavy machinery. ? Drink alcohol. ? Take sleeping pills or medicines that cause drowsiness. ? Make important decisions or sign legal documents. ? Take care of children on your own.  Rest. Eating and drinking  Follow the diet that is recommended by your health care provider.  If you vomit, drink water, juice, or soup when you can drink without vomiting.  Make sure you have little or no nausea before eating solid foods. General instructions  Have a responsible adult stay with you until you are awake and alert.  Take over-the-counter and prescription medicines only as told by your health care provider.  If you smoke, do not smoke without supervision.  Keep all follow-up visits as told by your health care provider. This is important. Contact a health care provider if:  You keep feeling nauseous or you keep vomiting.  You feel light-headed.  You develop a rash.  You have a fever. Get help right away if:  You have trouble breathing. This information is not intended to replace advice given to you by your health care provider. Make sure you discuss any questions you have with your health care provider. Document Released: 09/25/2015 Document Revised: 01/25/2016 Document Reviewed: 09/25/2015 Elsevier Interactive Patient Education  [employer].

## 2018-03-26 ENCOUNTER — Other Ambulatory Visit: Payer: Self-pay

## 2018-03-26 ENCOUNTER — Encounter (HOSPITAL_COMMUNITY)
Admission: RE | Admit: 2018-03-26 | Discharge: 2018-03-26 | Disposition: A | Discharge status: Home / Self Care | Payer: Medicare Other | Source: Ambulatory Visit | Attending: Orthopedic Surgery | Admitting: Orthopedic Surgery

## 2018-03-26 ENCOUNTER — Encounter (HOSPITAL_COMMUNITY): Payer: Self-pay

## 2018-03-26 DIAGNOSIS — Z79899 Other long term (current) drug therapy: Secondary | ICD-10-CM | POA: Diagnosis not present

## 2018-03-26 DIAGNOSIS — G8929 Other chronic pain: Secondary | ICD-10-CM | POA: Diagnosis not present

## 2018-03-26 DIAGNOSIS — N400 Enlarged prostate without lower urinary tract symptoms: Secondary | ICD-10-CM | POA: Diagnosis not present

## 2018-03-26 DIAGNOSIS — E039 Hypothyroidism, unspecified: Secondary | ICD-10-CM | POA: Diagnosis not present

## 2018-03-26 DIAGNOSIS — Z87891 Personal history of nicotine dependence: Secondary | ICD-10-CM | POA: Diagnosis not present

## 2018-03-26 DIAGNOSIS — M1990 Unspecified osteoarthritis, unspecified site: Secondary | ICD-10-CM | POA: Diagnosis not present

## 2018-03-26 DIAGNOSIS — G5602 Carpal tunnel syndrome, left upper limb: Secondary | ICD-10-CM | POA: Diagnosis not present

## 2018-03-26 DIAGNOSIS — E7800 Pure hypercholesterolemia, unspecified: Secondary | ICD-10-CM | POA: Diagnosis not present

## 2018-03-26 HISTORY — DX: Hypothyroidism, unspecified: E03.9

## 2018-03-26 LAB — CBC WITH DIFFERENTIAL/PLATELET
ABS IMMATURE GRANULOCYTES: 0.01 10*3/uL (ref 0.00–0.07)
BASOS PCT: 1 %
Basophils Absolute: 0 10*3/uL (ref 0.0–0.1)
Eosinophils Absolute: 0.1 10*3/uL (ref 0.0–0.5)
Eosinophils Relative: 1 %
HEMATOCRIT: 38.7 % — AB (ref 39.0–52.0)
Hemoglobin: 13.1 g/dL (ref 13.0–17.0)
Immature Granulocytes: 0 %
Lymphocytes Relative: 25 %
Lymphs Abs: 1.5 10*3/uL (ref 0.7–4.0)
MCH: 29.8 pg (ref 26.0–34.0)
MCHC: 33.9 g/dL (ref 30.0–36.0)
MCV: 88 fL (ref 80.0–100.0)
MONOS PCT: 8 %
Monocytes Absolute: 0.5 10*3/uL (ref 0.1–1.0)
Neutro Abs: 3.8 10*3/uL (ref 1.7–7.7)
Neutrophils Relative %: 65 %
PLATELETS: 219 10*3/uL (ref 150–400)
RBC: 4.4 MIL/uL (ref 4.22–5.81)
RDW: 11.9 % (ref 11.5–15.5)
WBC: 5.8 10*3/uL (ref 4.0–10.5)
nRBC: 0 % (ref 0.0–0.2)

## 2018-03-26 LAB — BASIC METABOLIC PANEL
Anion gap: 8 (ref 5–15)
BUN: 22 mg/dL — AB (ref 6–20)
CO2: 23 mmol/L (ref 22–32)
Calcium: 9.7 mg/dL (ref 8.9–10.3)
Chloride: 107 mmol/L (ref 98–111)
Creatinine, Ser: 0.96 mg/dL (ref 0.61–1.24)
GFR calc Af Amer: >60 mL/min (ref >60)
GFR calc non Af Amer: >60 mL/min (ref >60)
Glucose, Bld: 104 mg/dL — ABNORMAL HIGH (ref 70–99)
Potassium: 4.3 mmol/L (ref 3.5–5.1)
Sodium: 138 mmol/L (ref 135–145)

## 2018-03-27 ENCOUNTER — Encounter (HOSPITAL_COMMUNITY)
Admission: RE | Disposition: A | Discharge status: Home / Self Care | Payer: Self-pay | Source: Ambulatory Visit | Attending: Orthopedic Surgery

## 2018-03-27 ENCOUNTER — Ambulatory Visit (HOSPITAL_COMMUNITY): Payer: Medicare Other | Admitting: Anesthesiology

## 2018-03-27 ENCOUNTER — Other Ambulatory Visit: Payer: Self-pay

## 2018-03-27 ENCOUNTER — Ambulatory Visit (HOSPITAL_COMMUNITY)
Admission: RE | Admit: 2018-03-27 | Discharge: 2018-03-27 | Disposition: A | Discharge status: Home / Self Care | Payer: Medicare Other | Source: Ambulatory Visit | Attending: Orthopedic Surgery | Admitting: Orthopedic Surgery

## 2018-03-27 ENCOUNTER — Encounter (HOSPITAL_COMMUNITY): Payer: Self-pay | Admitting: *Deleted

## 2018-03-27 DIAGNOSIS — N400 Enlarged prostate without lower urinary tract symptoms: Secondary | ICD-10-CM | POA: Insufficient documentation

## 2018-03-27 DIAGNOSIS — Z79899 Other long term (current) drug therapy: Secondary | ICD-10-CM | POA: Insufficient documentation

## 2018-03-27 DIAGNOSIS — E7800 Pure hypercholesterolemia, unspecified: Secondary | ICD-10-CM | POA: Insufficient documentation

## 2018-03-27 DIAGNOSIS — G8929 Other chronic pain: Secondary | ICD-10-CM | POA: Diagnosis not present

## 2018-03-27 DIAGNOSIS — E039 Hypothyroidism, unspecified: Secondary | ICD-10-CM | POA: Diagnosis not present

## 2018-03-27 DIAGNOSIS — Z9889 Other specified postprocedural states: Secondary | ICD-10-CM

## 2018-03-27 DIAGNOSIS — G5602 Carpal tunnel syndrome, left upper limb: Secondary | ICD-10-CM | POA: Insufficient documentation

## 2018-03-27 DIAGNOSIS — M1990 Unspecified osteoarthritis, unspecified site: Secondary | ICD-10-CM | POA: Insufficient documentation

## 2018-03-27 DIAGNOSIS — Z87891 Personal history of nicotine dependence: Secondary | ICD-10-CM | POA: Insufficient documentation

## 2018-03-27 HISTORY — PX: CARPAL TUNNEL RELEASE: SHX101

## 2018-03-27 SURGERY — CARPAL TUNNEL RELEASE
Anesthesia: Regional | Laterality: Left

## 2018-03-27 MED ORDER — FENTANYL CITRATE (PF) 100 MCG/2ML IJ SOLN
INTRAMUSCULAR | Status: AC
  Filled 2018-03-27: qty 2

## 2018-03-27 MED ORDER — BUPIVACAINE HCL (PF) 0.5 % IJ SOLN
INTRAMUSCULAR | Status: AC
  Filled 2018-03-27: qty 30

## 2018-03-27 MED ORDER — LIDOCAINE HCL (PF) 0.5 % IJ SOLN
INTRAMUSCULAR | Status: DC | PRN
  Administered 2018-03-27: 50 mL via INTRAVENOUS

## 2018-03-27 MED ORDER — ONDANSETRON HCL 4 MG/2ML IJ SOLN
INTRAMUSCULAR | Status: DC | PRN
  Administered 2018-03-27: 4 mg via INTRAVENOUS

## 2018-03-27 MED ORDER — MIDAZOLAM HCL 2 MG/2ML IJ SOLN: 0.5000 mg | Freq: Once | INTRAMUSCULAR | Status: DC | PRN

## 2018-03-27 MED ORDER — LIDOCAINE 2% (20 MG/ML) 5 ML SYRINGE
INTRAMUSCULAR | Status: DC | PRN
  Administered 2018-03-27: 40 mg via INTRAVENOUS

## 2018-03-27 MED ORDER — MIDAZOLAM HCL 5 MG/5ML IJ SOLN
INTRAMUSCULAR | Status: DC | PRN
  Administered 2018-03-27: 2 mg via INTRAVENOUS

## 2018-03-27 MED ORDER — LIDOCAINE HCL (PF) 0.5 % IJ SOLN
INTRAMUSCULAR | Status: AC
  Filled 2018-03-27: qty 50

## 2018-03-27 MED ORDER — LACTATED RINGERS IV SOLN
INTRAVENOUS | Status: DC
  Administered 2018-03-27: 09:00:00 via INTRAVENOUS

## 2018-03-27 MED ORDER — PROPOFOL 10 MG/ML IV BOLUS
INTRAVENOUS | Status: AC
  Filled 2018-03-27: qty 20

## 2018-03-27 MED ORDER — DEXAMETHASONE SODIUM PHOSPHATE 10 MG/ML IJ SOLN
INTRAMUSCULAR | Status: DC | PRN
  Administered 2018-03-27: 4 mg via INTRAVENOUS

## 2018-03-27 MED ORDER — PROMETHAZINE HCL 25 MG/ML IJ SOLN: 6.2500 mg | INTRAMUSCULAR | Status: DC | PRN

## 2018-03-27 MED ORDER — LIDOCAINE HCL (PF) 1 % IJ SOLN
INTRAMUSCULAR | Status: AC
  Filled 2018-03-27: qty 5

## 2018-03-27 MED ORDER — FENTANYL CITRATE (PF) 100 MCG/2ML IJ SOLN
INTRAMUSCULAR | Status: DC | PRN
  Administered 2018-03-27 (×2): 50 ug via INTRAVENOUS

## 2018-03-27 MED ORDER — ONDANSETRON HCL 4 MG/2ML IJ SOLN
INTRAMUSCULAR | Status: AC
  Filled 2018-03-27: qty 2

## 2018-03-27 MED ORDER — 0.9 % SODIUM CHLORIDE (POUR BTL) OPTIME
TOPICAL | Status: DC | PRN
  Administered 2018-03-27: 1000 mL

## 2018-03-27 MED ORDER — CHLORHEXIDINE GLUCONATE 4 % EX LIQD: 60.0000 mL | Freq: Once | CUTANEOUS | Status: DC

## 2018-03-27 MED ORDER — BUPIVACAINE HCL (PF) 0.5 % IJ SOLN
INTRAMUSCULAR | Status: DC | PRN
  Administered 2018-03-27: 10 mL

## 2018-03-27 MED ORDER — CEFAZOLIN SODIUM-DEXTROSE 2-4 GM/100ML-% IV SOLN
2.0000 g | INTRAVENOUS | Status: AC
  Administered 2018-03-27: 2 g via INTRAVENOUS
  Filled 2018-03-27: qty 100

## 2018-03-27 MED ORDER — DEXAMETHASONE SODIUM PHOSPHATE 4 MG/ML IJ SOLN
INTRAMUSCULAR | Status: AC
  Filled 2018-03-27: qty 1

## 2018-03-27 MED ORDER — MIDAZOLAM HCL 2 MG/2ML IJ SOLN
INTRAMUSCULAR | Status: AC
  Filled 2018-03-27: qty 2

## 2018-03-27 MED ORDER — PROPOFOL 10 MG/ML IV BOLUS
INTRAVENOUS | Status: DC | PRN
  Administered 2018-03-27 (×5): 20 mg via INTRAVENOUS

## 2018-03-27 SURGICAL SUPPLY — 41 items
BANDAGE ELASTIC 3 LF NS (GAUZE/BANDAGES/DRESSINGS) ×3 IMPLANT
BANDAGE ESMARK 4X12 BL STRL LF (DISPOSABLE) ×1 IMPLANT
BLADE SURG 15 STRL LF DISP TIS (BLADE) ×1 IMPLANT
BLADE SURG 15 STRL SS (BLADE) ×3
BNDG CMPR 12X4 ELC STRL LF (DISPOSABLE) ×1
BNDG CMPR MED 5X3 ELC HKLP NS (GAUZE/BANDAGES/DRESSINGS) ×1
BNDG COHESIVE 4X5 TAN STRL (GAUZE/BANDAGES/DRESSINGS) ×3 IMPLANT
BNDG ESMARK 4X12 BLUE STRL LF (DISPOSABLE) ×3
BNDG GAUZE ELAST 4 BULKY (GAUZE/BANDAGES/DRESSINGS) ×3 IMPLANT
CHLORAPREP W/TINT 26ML (MISCELLANEOUS) ×3 IMPLANT
CLOTH BEACON ORANGE TIMEOUT ST (SAFETY) ×3 IMPLANT
COVER LIGHT HANDLE STERIS (MISCELLANEOUS) ×6 IMPLANT
CUFF TOURNIQUET SINGLE 18IN (TOURNIQUET CUFF) ×3 IMPLANT
DECANTER SPIKE VIAL GLASS SM (MISCELLANEOUS) ×3 IMPLANT
DRAPE PROXIMA HALF (DRAPES) ×3 IMPLANT
DRSG XEROFORM 1X8 (GAUZE/BANDAGES/DRESSINGS) ×3 IMPLANT
ELECT NDL TIP 2.8 STRL (NEEDLE) ×1 IMPLANT
ELECT NEEDLE TIP 2.8 STRL (NEEDLE) ×3 IMPLANT
ELECT REM PT RETURN 9FT ADLT (ELECTROSURGICAL) ×3
ELECTRODE REM PT RTRN 9FT ADLT (ELECTROSURGICAL) ×1 IMPLANT
GAUZE SPONGE 4X4 12PLY STRL (GAUZE/BANDAGES/DRESSINGS) ×3 IMPLANT
GLOVE BIOGEL M 7.0 STRL (GLOVE) ×2 IMPLANT
GLOVE BIOGEL PI IND STRL 7.0 (GLOVE) ×1 IMPLANT
GLOVE BIOGEL PI INDICATOR 7.0 (GLOVE) ×4
GLOVE SKINSENSE NS SZ8.0 LF (GLOVE) ×2
GLOVE SKINSENSE STRL SZ8.0 LF (GLOVE) ×1 IMPLANT
GLOVE SS N UNI LF 8.5 STRL (GLOVE) ×3 IMPLANT
GOWN STRL REUS W/ TWL LRG LVL3 (GOWN DISPOSABLE) ×1 IMPLANT
GOWN STRL REUS W/TWL LRG LVL3 (GOWN DISPOSABLE) ×3
GOWN STRL REUS W/TWL XL LVL3 (GOWN DISPOSABLE) ×3 IMPLANT
KIT TURNOVER KIT A (KITS) ×3 IMPLANT
MANIFOLD NEPTUNE II (INSTRUMENTS) ×3 IMPLANT
NDL HYPO 21X1.5 SAFETY (NEEDLE) ×1 IMPLANT
NEEDLE HYPO 21X1.5 SAFETY (NEEDLE) ×3 IMPLANT
NS IRRIG 1000ML POUR BTL (IV SOLUTION) ×3 IMPLANT
PACK BASIC LIMB (CUSTOM PROCEDURE TRAY) ×3 IMPLANT
PAD ARMBOARD 7.5X6 YLW CONV (MISCELLANEOUS) ×3 IMPLANT
POSITIONER HAND ALUMI XLG (MISCELLANEOUS) ×3 IMPLANT
SET BASIN LINEN APH (SET/KITS/TRAYS/PACK) ×3 IMPLANT
SUT ETHILON 3 0 FSL (SUTURE) ×3 IMPLANT
SYR CONTROL 10ML LL (SYRINGE) ×3 IMPLANT

## 2018-03-27 NOTE — Discharge Instructions (Signed)
I released the transverse carpal ligament/did a carpal tunnel release and the nerve was compressed.  It is okay to use the oxycodone for pain control her last prescription was on September 16 and 120 pills were released for 30-day supply        Monitored Anesthesia Care, Care After These instructions provide you with information about caring for yourself after your procedure. Your health care provider may also give you more specific instructions. Your treatment has been planned according to current medical practices, but problems sometimes occur. Call your health care provider if you have any problems or questions after your procedure. What can I expect after the procedure? After your procedure, it is common to:  Feel sleepy for several hours.  Feel clumsy and have poor balance for several hours.  Feel forgetful about what happened after the procedure.  Have poor judgment for several hours.  Feel nauseous or vomit.  Have a sore throat if you had a breathing tube during the procedure.  Follow these instructions at home: For at least 24 hours after the procedure:   Do not: ? Participate in activities in which you could fall or become injured. ? Drive. ? Use heavy machinery. ? Drink alcohol. ? Take sleeping pills or medicines that cause drowsiness. ? Make important decisions or sign legal documents. ? Take care of children on your own.  Rest. Eating and drinking  Follow the diet that is recommended by your health care provider.  If you vomit, drink water, juice, or soup when you can drink without vomiting.  Make sure you have little or no nausea before eating solid foods. General instructions  Have a responsible adult stay with you until you are awake and alert.  Take over-the-counter and prescription medicines only as told by your health care provider.  If you smoke, do not smoke without supervision.  Keep all follow-up visits as told by your health care  provider. This is important. Contact a health care provider if:  You keep feeling nauseous or you keep vomiting.  You feel light-headed.  You develop a rash.  You have a fever. Get help right away if:  You have trouble breathing. This information is not intended to replace advice given to you by your health care provider. Make sure you discuss any questions you have with your health care provider. Document Released: 09/25/2015 Document Revised: 01/25/2016 Document Reviewed: 09/25/2015 Elsevier Interactive Patient Education  [employer].

## 2018-03-27 NOTE — Interval H&P Note (Signed)
History and Physical Interval Note:  03/27/2018 9:55 AM  Adam Robles  has presented today for surgery, with the diagnosis of left carpal tunnel release  The various methods of treatment have been discussed with the patient and family. After consideration of risks, benefits and other options for treatment, the patient has consented to  Procedure(s): CARPAL TUNNEL RELEASE (Left) as a surgical intervention .  The patient's history has been reviewed, patient examined, no change in status, stable for surgery.  I have reviewed the patient's chart and labs.  Questions were answered to the patient's satisfaction.     Fuller Canada

## 2018-03-27 NOTE — Anesthesia Procedure Notes (Signed)
Date/Time: 03/27/2018 10:10 AM Performed by: Jhonnie Garner, CRNA Oxygen Delivery Method: Simple face mask

## 2018-03-27 NOTE — Anesthesia Preprocedure Evaluation (Signed)
Anesthesia Evaluation  Patient identified by MRN, date of birth, ID band Patient awake    Reviewed: Allergy & Precautions, NPO status , Patient's Chart, lab work & pertinent test results  Airway Mallampati: I  TM Distance: >3 FB Neck ROM: Full    Dental no notable dental hx. (+) Edentulous Upper, Edentulous Lower   Pulmonary neg pulmonary ROS, former smoker,    Pulmonary exam normal breath sounds clear to auscultation       Cardiovascular Exercise Tolerance: Good negative cardio ROS Normal cardiovascular examI Rhythm:Regular Rate:Normal  Denies MI/CP/DOE Is limited by hip -occ uses cane    Neuro/Psych negative neurological ROS  negative psych ROS   GI/Hepatic negative GI ROS, Neg liver ROS,   Endo/Other  Hypothyroidism   Renal/GU negative Renal ROS  negative genitourinary   Musculoskeletal  (+) Arthritis , Osteoarthritis,    Abdominal   Peds negative pediatric ROS (+)  Hematology negative hematology ROS (+)   Anesthesia Other Findings   Reproductive/Obstetrics negative OB ROS                             Anesthesia Physical Anesthesia Plan  ASA: II  Anesthesia Plan: Bier Block and Bier Block-LIDOCAINE ONLY   Post-op Pain Management:    Induction: Intravenous  PONV Risk Score and Plan:   Airway Management Planned: Nasal Cannula and Simple Face Mask  Additional Equipment:   Intra-op Plan:   Post-operative Plan:   Informed Consent: I have reviewed the patients History and Physical, chart, labs and discussed the procedure including the risks, benefits and alternatives for the proposed anesthesia with the patient or authorized representative who has indicated his/her understanding and acceptance.   Dental advisory given  Plan Discussed with: CRNA  Anesthesia Plan Comments: (Bier with MAC explained - GA vs GETA as needed )        Anesthesia Quick Evaluation

## 2018-03-27 NOTE — Transfer of Care (Signed)
Immediate Anesthesia Transfer of Care Note  Patient: Adam Robles  Procedure(s) Performed: LEFT CARPAL TUNNEL RELEASE (Left )  Patient Location: PACU  Anesthesia Type:MAC  Level of Consciousness: awake, alert  and oriented  Airway & Oxygen Therapy: Patient Spontanous Breathing  Post-op Assessment: Report given to RN and Post -op Vital signs reviewed and stable  Post vital signs: Reviewed and stable  Last Vitals:  Vitals Value Taken Time  BP    Temp    Pulse 58 03/27/2018 10:37 AM  Resp 19 03/27/2018 10:37 AM  SpO2 100 % 03/27/2018 10:37 AM  Vitals shown include unvalidated device data.  Last Pain:  Vitals:   03/27/18 0828  TempSrc: Oral  PainSc: 4       Patients Stated Pain Goal: 6 (93/90/30 0923)  Complications: No apparent anesthesia complications

## 2018-03-27 NOTE — Anesthesia Procedure Notes (Signed)
Anesthesia Regional Block: Bier block (IV Regional)   Pre-Anesthetic Checklist: ,, timeout performed, Correct Patient, Correct Site, Correct Laterality, Correct Procedure, Correct Position, site marked, Risks and benefits discussed, Surgical consent,  Pre-op evaluation,  At surgeon's request  Laterality: Left  Prep: chloraprep       Needles:       Needle Gauge: 22     Additional Needles:   Procedures:,,,,, intact distal pulses, Esmarch exsanguination, single tourniquet utilized,  Narrative:  Start time: 03/27/2018 10:05 AM End time: 03/27/2018 10:10 AM  Performed by: Personally  CRNA: Jhonnie Garner, CRNA

## 2018-03-27 NOTE — Brief Op Note (Signed)
03/27/2018  10:40 AM  PATIENT:  Adam Robles  57 y.o. male  PRE-OPERATIVE DIAGNOSIS:  left carpal tunnel release  POST-OPERATIVE DIAGNOSIS:  left carpal tunnel release  PROCEDURE:  Procedure(s): LEFT CARPAL TUNNEL RELEASE (Left) - [id number]  SURGEON:  Surgeon(s) and Role:    Vickki Hearing, MD - Primary  PHYSICIAN ASSISTANT:   ASSISTANTS: none   ANESTHESIA:   regional  EBL:  0 mL   BLOOD ADMINISTERED:none  DRAINS: none   LOCAL MEDICATIONS USED:  MARCAINE     SPECIMEN:  No Specimen  DISPOSITION OF SPECIMEN:  N/A  COUNTS:  YES  TOURNIQUET:   Total Tourniquet Time Documented: Upper Arm (Left) - 28 minutes Total: Upper Arm (Left) - 28 minutes   DICTATION: .Reubin Milan Dictation  PLAN OF CARE: Discharge to home after PACU  PATIENT DISPOSITION:  PACU - hemodynamically stable.   Delay start of Pharmacological VTE agent (>24hrs) due to surgical blood loss or risk of bleeding: not applicable

## 2018-03-27 NOTE — Anesthesia Postprocedure Evaluation (Signed)
Anesthesia Post Note  Patient: Adam Robles  Procedure(s) Performed: LEFT CARPAL TUNNEL RELEASE (Left )  Patient location during evaluation: PACU Anesthesia Type: Bier Block Level of consciousness: awake and alert Pain management: pain level controlled Vital Signs Assessment: post-procedure vital signs reviewed and stable Respiratory status: spontaneous breathing Cardiovascular status: blood pressure returned to baseline Postop Assessment: no apparent nausea or vomiting Anesthetic complications: no     Last Vitals:  Vitals:   03/27/18 0828  BP: 127/80  Pulse: 69  Resp: 12  Temp: 36.6 C  SpO2: 96%    Last Pain:  Vitals:   03/27/18 0828  TempSrc: Oral  PainSc: 4                  Liesel Peckenpaugh

## 2018-03-27 NOTE — Op Note (Signed)
03/27/2018  10:40 AM  PATIENT:  Adam Robles  57 y.o. male  PRE-OPERATIVE DIAGNOSIS:  left carpal tunnel release  POST-OPERATIVE DIAGNOSIS:  left carpal tunnel release  PROCEDURE:  Procedure(s): LEFT CARPAL TUNNEL RELEASE (Left) - [phone]  Carpal tunnel release left wrist  Preop diagnosis carpal tunnel syndrome left wrist   postop diagnosis same  Procedure open carpal tunnel release left wrist Surgeon Romeo Apple  Anesthesia regional Bier block  Operative findings compression of the left median nerve large expansion of the thenar muscles over the transverse carpal ligament, without any anterior carpal tunnel masses   Indications failure of conservative treatment to relieve pain and paresthesias and numbness and tingling of the left hand  The patient was identified in the preop area we confirm the surgical site marked as left wrist. Chart update completed. Patient taken to surgery. he had 2 g of Ancef. After establishing a Bier block left her arm was prepped with ChloraPrep.  Timeout executed completed and confirmed site.  A straight incision was made over the left carpal tunnel in line with the radial border of the ring finger. Blunt dissection was carried out to find the distal aspect of the carpal tunnel. A blunted instrument was passed beneath the carpal tunnel. Sharp incision was then used to release the transverse carpal ligament. The contents of the carpal tunnel were inspected. The median nerve was compressed with slight discoloration.  The wound was irrigated and then closed with 3-0 nylon suture. We injected 10 mL of plain Marcaine on the radial side of the incision  A sterile bandage was applied and the tourniquet was released the color of the hand and capillary refill were normal  The patient was taken to the recovery room in stable condition  SURGEON:  Surgeon(s) and Role:    * Vickki Hearing, MD - Primary  PHYSICIAN ASSISTANT:   ASSISTANTS: none    ANESTHESIA:   regional  EBL:  0 mL   BLOOD ADMINISTERED:none  DRAINS: none   LOCAL MEDICATIONS USED:  MARCAINE     SPECIMEN:  No Specimen  DISPOSITION OF SPECIMEN:  N/A  COUNTS:  YES  TOURNIQUET:   Total Tourniquet Time Documented: Upper Arm (Left) - 28 minutes Total: Upper Arm (Left) - 28 minutes   DICTATION: .Reubin Milan Dictation  PLAN OF CARE: Discharge to home after PACU  PATIENT DISPOSITION:  PACU - hemodynamically stable.   Delay start of Pharmacological VTE agent (>24hrs) due to surgical blood loss or risk of bleeding: not applicable

## 2018-03-27 NOTE — Addendum Note (Signed)
Addendum  created 03/27/18 1050 by Jhonnie Garner, CRNA   Charge Capture section accepted

## 2018-03-28 ENCOUNTER — Encounter (HOSPITAL_COMMUNITY): Payer: Self-pay | Admitting: Orthopedic Surgery

## 2018-04-09 ENCOUNTER — Ambulatory Visit (INDEPENDENT_AMBULATORY_CARE_PROVIDER_SITE_OTHER): Payer: Medicare Other | Admitting: Orthopedic Surgery

## 2018-04-09 ENCOUNTER — Encounter: Payer: Self-pay | Admitting: Orthopedic Surgery

## 2018-04-09 VITALS — BP 122/76 | HR 91 | Ht 73.0 in | Wt 168.0 lb

## 2018-04-09 DIAGNOSIS — G5602 Carpal tunnel syndrome, left upper limb: Secondary | ICD-10-CM

## 2018-04-09 NOTE — Progress Notes (Signed)
POSTOP VISIT  POD # 13  Chief Complaint  Patient presents with  . Carpal Tunnel    Release left hand DOS 03/27/18    Mr. Adam Robles is doing well he reports improvement in the numbness tingling and pain in his left upper extremity  03/27/2018  10:40 AM  PATIENT:  Adam Robles  57 y.o. male  PRE-OPERATIVE DIAGNOSIS:  left carpal tunnel release  POST-OPERATIVE DIAGNOSIS:  left carpal tunnel release  PROCEDURE:  Procedure(s): LEFT CARPAL TUNNEL RELEASE (Left) - [id number]  SURGEON:  Surgeon(s) and Role:    Vickki Hearing, MD - Primary    His wound is clean the sutures were removed his finger flexion is excellent and he has no swelling in the hand he has residual sensory decrease in the tips of the thumb index and long finger   Encounter Diagnosis  Name Primary?  . s/p Carpal tunnel syndrome on left: 03/27/18 Yes     Postoperative plan (Work, WB, No orders of the defined types were placed in this encounter. ,FU)  Released today follow-up as needed

## 2019-12-16 IMAGING — MR MR WRIST*L* W/O CM
4 of 6 series · 13 of 40 positions shown · non-contrast
Comparison: Radiographs from 01/02/2018

CLINICAL DATA: Left wrist swelling, pain, and numbness in the
fingers for the last 6 months.

EXAM:
MR OF THE LEFT WRIST WITHOUT CONTRAST
TECHNIQUE: Multiplanar, multisequence MR imaging of the left wrist was
performed. No intravenous contrast was administered.

[Series 101: T1 · axial · 3.0mm · 0.19mm/px · z∈[-50,-1]mm · 3 of 24 slices shown (1 of 2)]
[im 4/24]
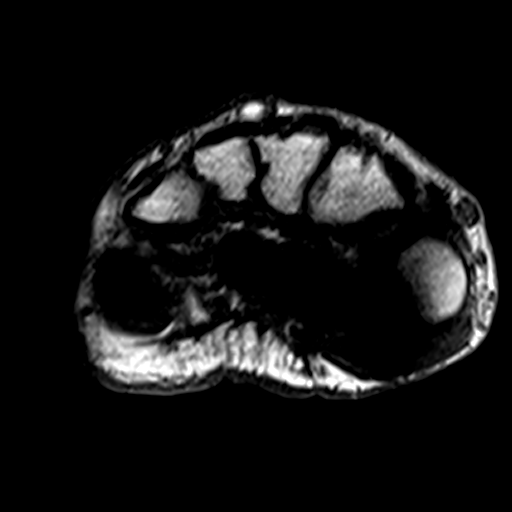
[im 14/24]
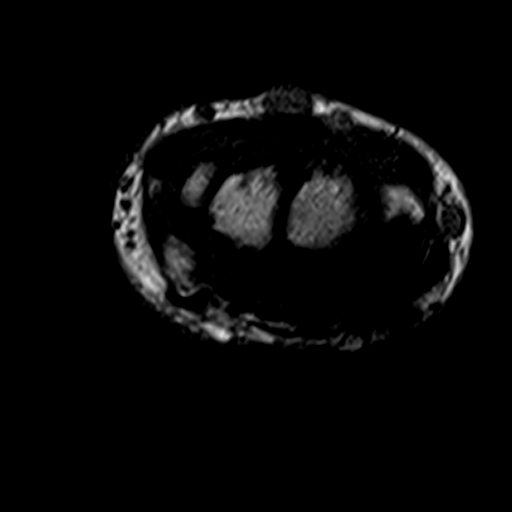
[im 20/24]
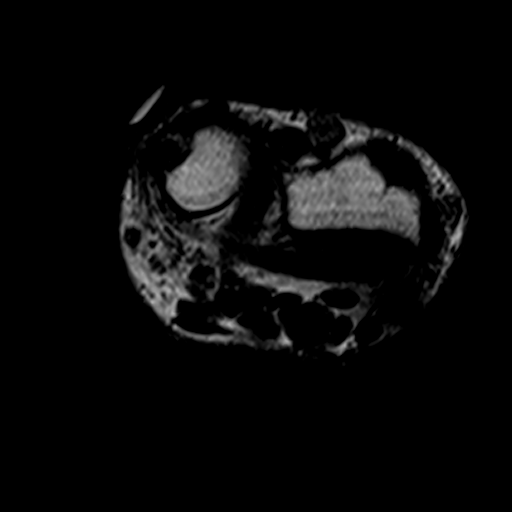

[Series 102: T1 · coronal · 3.0mm · 0.17mm/px · 3 of 18 slices shown (2 of 2)]
[im 4/18]
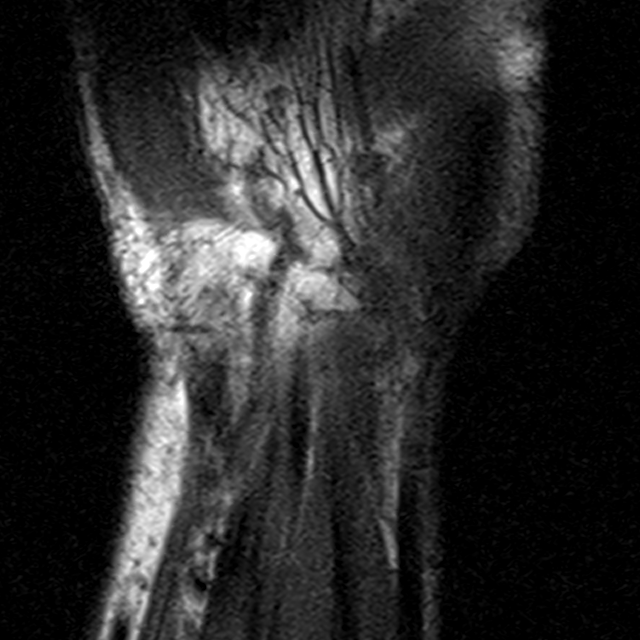
[im 11/18]
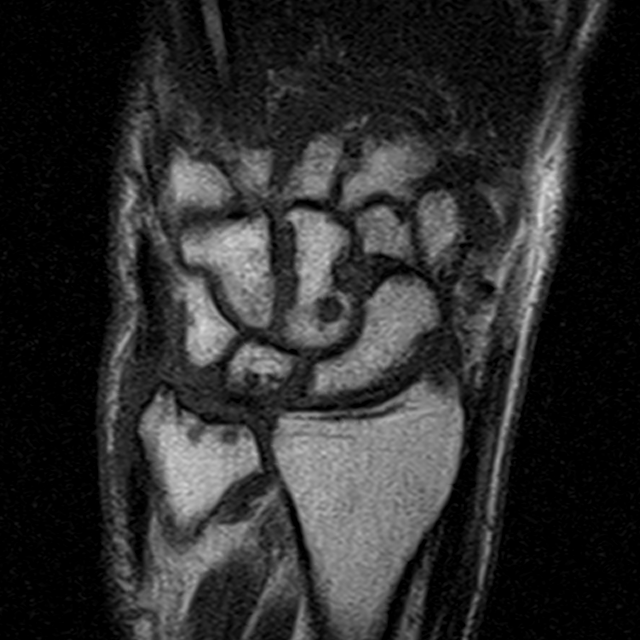
[im 18/18]
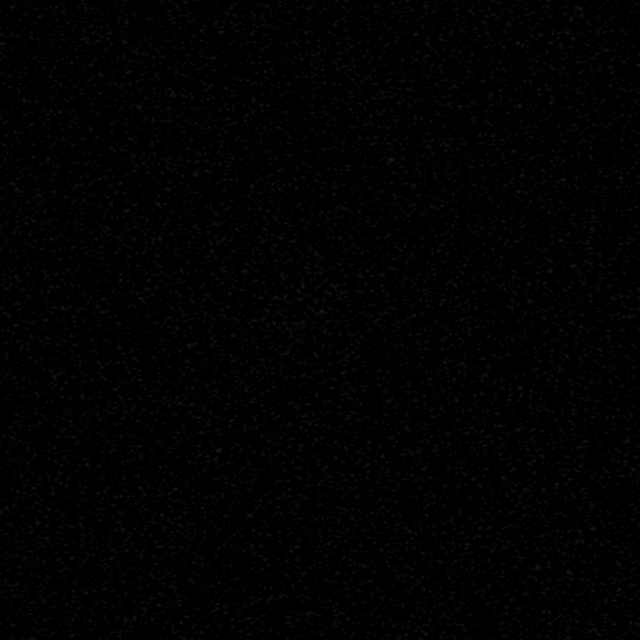

[Series 103: T2 fat-sat · coronal · 3.0mm · 0.17mm/px · 3 of 18 slices shown]
[im 4/18]
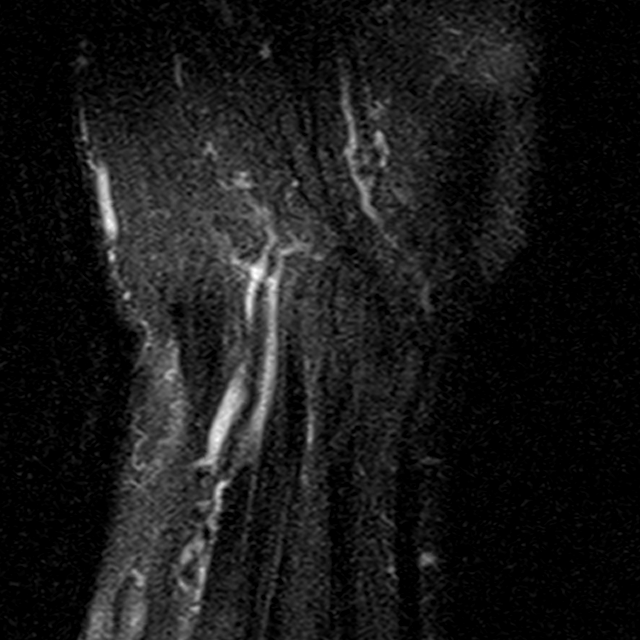
[im 11/18]
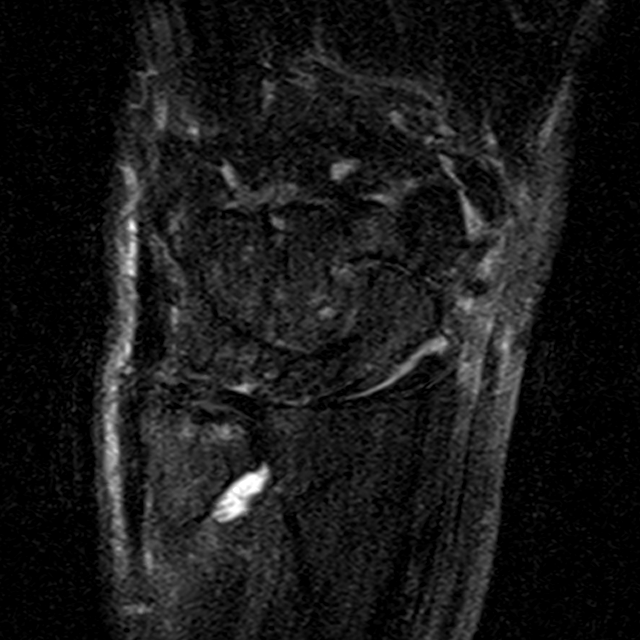
[im 18/18]
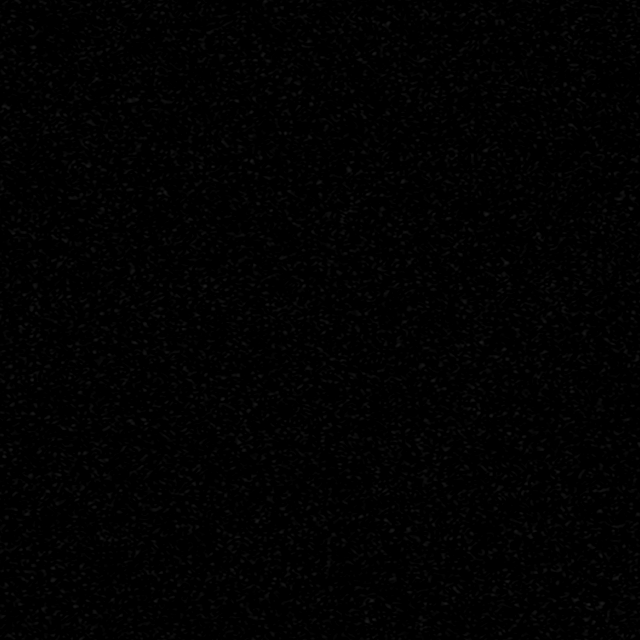

[Series 104: PD fat-sat · coronal · 3.0mm · 0.15mm/px · 4 of 18 slices shown]
[im 1/18]
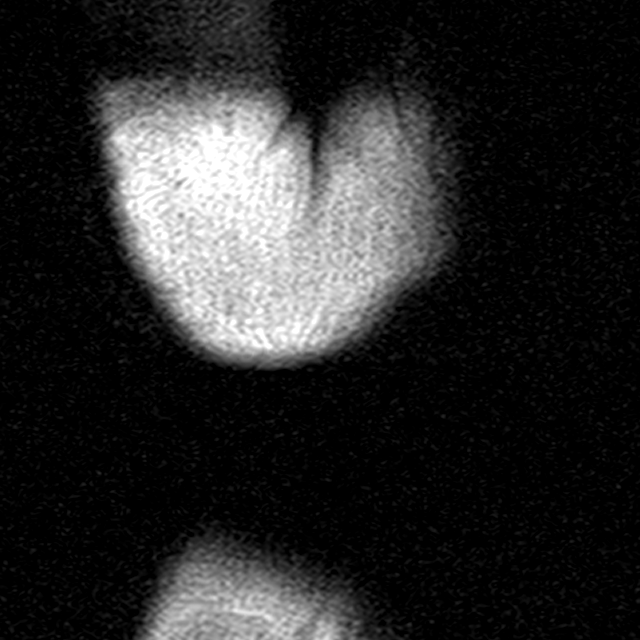
[im 4/18]
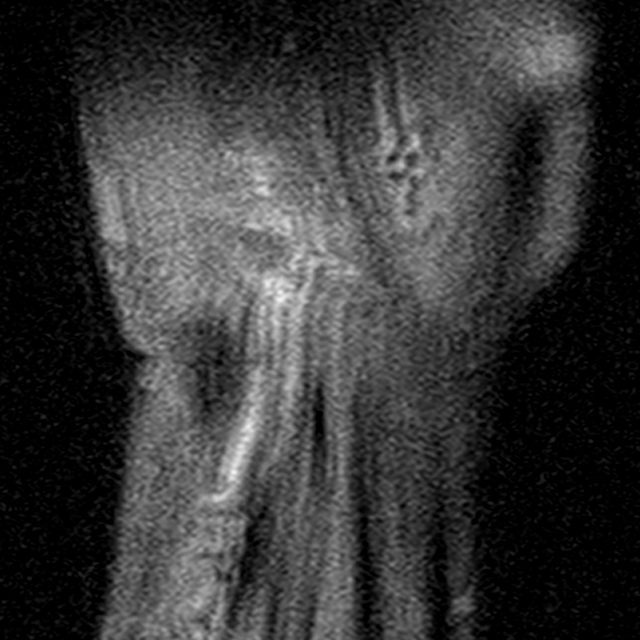
[im 11/18]
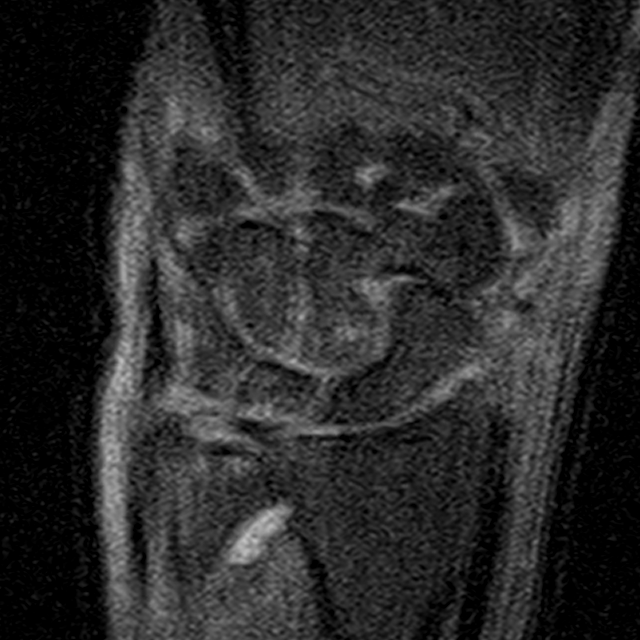
[im 18/18]
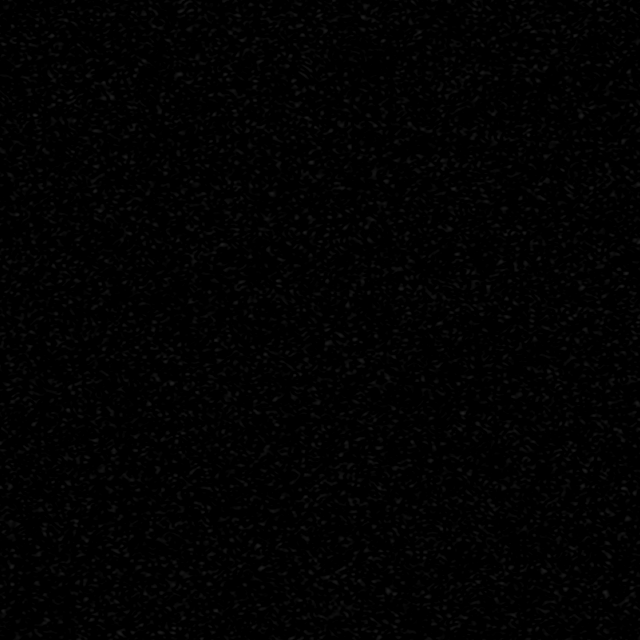

[13 of 40 positions shown; findings below may reference images not displayed]

FINDINGS: Despite efforts by the technologist and patient, motion artifact is
present on today's exam and could not be eliminated. This reduces
exam sensitivity and specificity.

Ligaments: On conventional radiography there is dorsal tilt of the
lunate and exaggerated scapholunate angle raising suspicion for
scapholunate ligament tear and dorsal intercalated segmental
instability. However, no definite fluid signal intensity or obvious
discontinuity is seen in the scapholunate ligament on today's MRI.

Triangular fibrocartilage: Accentuated T2 signal along the ulnar
side of the TFCC disc noted. Some of this may be from the patient's
known chondrocalcinosis. I do not see an obvious discontinuity in
the TFCC. However, there is a notable effusion of the distal
radioulnar joint.

Tendons: There is likely some tendinopathy of the flexor carpi
radialis tendon distally, with mild indistinctness and slight
expansion of the tendon on the T1 weighted images.

Carpal tunnel/median nerve: Unremarkable

Guyon's canal: Unremarkable

Joint/cartilage: Mildly reduced radiocarpal articular space likely
from degenerative chondral thinning. Foci of subcortical edema along
the distal radial and ulnar surfaces may be from erosions or
degenerative subcortical cysts.

Notable effusion of the distal radioulnar joint. Effusion of the
radiocarpal joint noted. There is at least mild synovitis in the
radiocarpal joint.

Degenerative spurring at the first carpometacarpal articulation.

Bones/carpal alignment: Scattered small T2 hyperintense lesions in
the carpus, potentially from geodes or erosions.

Other: Mild prominence of venous structures in the hand.
IMPRESSION: 1. Suspected erosions along the radiocarpal joints with effusion and
possible synovitis in the radiocarpal joint and distal radioulnar
joint. Appearance suspicious for erosive or inflammatory
arthropathy. The patient has known calcification in the TFCC disc,
based on conventional radiographs, and CPPD arthropathy is not
excluded.
2. Although not well seen on today's MRI, conventional radiographs
raise the possibility of a tear of the scapholunate ligament.
3. Distal flexor carpi radialis tendinopathy.
4. Despite efforts by the technologist and patient, motion artifact
is present on today's exam and could not be eliminated. This reduces
exam sensitivity and specificity.
